# Patient Record
Sex: Female | Born: 1990 | Race: White | Hispanic: No | Marital: Single | Smoking: Never smoker
Health system: Southern US, Community
[De-identification: ages and names within clinical notes are randomized; demographics above are authoritative.]

## PROBLEM LIST (undated history)

## (undated) DIAGNOSIS — I951 Orthostatic hypotension: Secondary | ICD-10-CM

## (undated) DIAGNOSIS — R55 Syncope and collapse: Secondary | ICD-10-CM

## (undated) HISTORY — PX: FOOT SURGERY: SHX648

## (undated) HISTORY — DX: Syncope and collapse: R55

## (undated) HISTORY — DX: Orthostatic hypotension: I95.1

## (undated) HISTORY — PX: SKIN BIOPSY: SHX1

---
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
## 2009-04-15 ENCOUNTER — Ambulatory Visit: Payer: Self-pay | Admitting: Emergency Medicine

## 2009-09-20 ENCOUNTER — Ambulatory Visit: Payer: Self-pay | Admitting: Emergency Medicine

## 2009-09-20 LAB — CONVERTED CEMR LAB
Alkaline Phosphatase: 58 units/L (ref 39–117)
BUN: 35 mg/dL — ABNORMAL HIGH (ref 6–23)
Basophils Absolute: 0 10*3/uL (ref 0.0–0.1)
Basophils Relative: 0.6 % (ref 0.0–3.0)
Bilirubin, Direct: 0.2 mg/dL (ref 0.0–0.3)
CO2: 30 meq/L (ref 19–32)
Eosinophils Absolute: 0.1 10*3/uL (ref 0.0–0.7)
GFR calc non Af Amer: 101.26 mL/min (ref >60)
Glucose, Bld: 78 mg/dL (ref 70–99)
HCT: 39.4 % (ref 36.0–46.0)
Hemoglobin: 13.8 g/dL (ref 12.0–15.0)
MCHC: 34.9 g/dL (ref 30.0–36.0)
MCV: 89.6 fL (ref 78.0–100.0)
Monocytes Absolute: 0.4 10*3/uL (ref 0.1–1.0)
Monocytes Relative: 9.8 % (ref 3.0–12.0)
Neutro Abs: 2.3 10*3/uL (ref 1.4–7.7)
Neutrophils Relative %: 53.1 % (ref 43.0–77.0)
Platelets: 179 10*3/uL (ref 150.0–400.0)
Total Protein: 7.1 g/dL (ref 6.0–8.3)
WBC: 4.3 10*3/uL — ABNORMAL LOW (ref 4.5–10.5)

## 2010-04-08 NOTE — Assessment & Plan Note (Signed)
Summary: NP follow up - positive PPD   Copy to:  Nahser  CC:  had positive PPD at school in [place] in [place].  would like to discuss meds and treatment before going back to school on Monday.  History of Present Illness: 20  yo woman, no significant PMH who has had difficulty breathing with exertion. Dx with exercise-induced asthma. Has required albuterol rarely - only while playing sports. Doesn't carry her inhaler with her reliably. No new issues or symptoms.   ROV 04/15/09 -- Doing well. Planning to play basketball at [employer] in [place]. She has her rescue inhaler available. Doesn't use it frequently at all. No new exacerbations since last visit.   September 20, 2009--Presents for follow up for abnormal PPD at school. 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 PPD came back positive. She was started on  INH 300mg  3 tabs  Tues/Fri. -started on end of march. She is tolerating ok. No known hx/exposure of TB. no night sweats, cough, hemoptysis or weight loss. Plan is for her to complete in 6 months. She needs note today saying that she does not have active TB.        Medications Prior to Update: 1)  Proair Hfa 108 (90 Base) Mcg/act Aers (Albuterol Sulfate) .... Inhale 2 Puffs Every 4 Hours As Needed  Current Medications (verified): 1)  Proair Hfa 108 (90 Base) Mcg/act Aers (Albuterol Sulfate) .... Inhale 2 Puffs Every 4 Hours As Needed  Allergies (verified): No Known Drug Allergies  Past History:  Past Surgical History: Last updated: 03/24/2007 mole removed from foot  Family History: Last updated: 12/21/2007 pat grandfather-emphysema, lung CA pat uncle-asthma twin sister- sports induced asthma mat grandfather-lung CA, prostate CA  Social History: Last updated: 09/20/2009 household: F M sister nephew.  Pt is single. Pt is in [employer] Patient never smoked.  This Summer there was water damage, possible mold exposure.   Risk Factors: Smoking Status: never  (12/21/2007)  Past Medical History:   ASTHMA, EXERCISE INDUCED (ICD-493.81) SYNCOPE (ICD-780.2) DYSPNEA (ICD-786.05) SHOULDER PAIN, RIGHT (ICD-719.41)  +PPD 05/2009 at college--started on INH    Social History: household: F M sister nephew.  Pt is single. Pt is in [employer] Patient never smoked.  This Summer there was water damage, possible mold exposure.   Review of Systems      See HPI  Vital Signs:  Patient profile:   20 year old female Height:      65 inches Weight:      127.25 pounds BMI:     21.25 O2 Sat:      99 % on Room air Temp:     97.4 degrees F oral Pulse rate:   52 / minute BP sitting:   108 / 68  (right arm) Cuff size:   regular  Vitals Entered By: Boone Master CNA/MA (September 20, 2009 11:05 AM)  O2 Flow:  Room air CC: had positive PPD at school in [place] in [place].  would like to discuss meds and treatment before going back to school on Monday Is Patient Diabetic? No Comments Medications reviewed with patient Daytime contact number verified with patient. Boone Master CNA/MA  September 20, 2009 11:06 AM    Physical Exam  Additional Exam:  GEN: A/Ox3; pleasant , NAD HEENT:  [place]/AT, , EACs-clear, TMs-wnl, NOSE-clear, THROAT-clear NECK:  Supple w/ fair ROM; no JVD; normal carotid impulses w/o bruits; no thyromegaly or nodules palpated; no lymphadenopathy. RESP  Clear to P & A; w/o,  wheezes/ rales/ or rhonchi. CARD:  RRR, no m/r/g   GI:   Soft & nt; nml bowel sounds; no organomegaly or masses detected. Musco: Warm bil,  no calf tenderness edema, clubbing, pulses intact     Impression & Recommendations:  Problem # 1:  POSITIVE PPD (ICD-795.5) +PPD 05/2009 started on INH at college.  Will check labs and cxr today.  complete rx as planned per college recs.   Orders: TLB-BMP (Basic Metabolic Panel-BMET) (80048-METABOL) TLB-CBC Platelet - w/Differential (85025-CBCD) TLB-Hepatic/Liver Function Pnl (80076-HEPATIC) Est. Patient Level IV  (21308)  Problem # 2:  ASTHMA, EXERCISE INDUCED (ICD-493.81) compensated w/ no flares.   Complete Medication List: 1)  Proair Hfa 108 (90 Base) Mcg/act Aers (Albuterol sulfate) .... Inhale 2 puffs every 4 hours as needed  Other Orders: T-2 View CXR (71020TC)  Patient Instructions: 1)  Continue on INH as directed.  2)  I will call with xray results.  3)  Please contact office for sooner follow up if symptoms do not improve or worsen  4)  follow up Dr. Delton Coombes as scheduled.    Immunization History:  Influenza Immunization History:    Influenza:  historical (02/06/2009)  Pneumovax Immunization History:    Pneumovax:  historical (04/09/2009)

## 2010-04-08 NOTE — Assessment & Plan Note (Signed)
Summary: asthma   Visit Type:  Follow-up Copy to:  Nahser  CC:  Dyspnea/Exercise induced asthma follow-up. The patient states she is having no problems with sob. She has not used the [employer] in 3 months..  History of Present Illness: 20 yo woman, no significant PMH who has had difficulty breathing with exertion. Dx with exercise-induced asthma. Has required albuterol rarely - only while playing sports. Doesn't carry her inhaler with her reliably. No new issues or symptoms.   ROV 04/15/09 -- Doing well. Planning to play basketball at [employer] in [place]. She has her rescue inhaler available. Doesn't use it frequently at all. No new exacerbations since last visit.       Current Medications (verified): 1)  Proair Hfa 108 (90 Base) Mcg/act Aers (Albuterol Sulfate) .... Inhale 2 Puffs Every 4 Hours As Needed  Allergies (verified): No Known Drug Allergies  Vital Signs:  Patient profile:   20 year old female Height:      65 inches (165.10 cm) Weight:      123 pounds (55.91 kg) BMI:     20.54 O2 Sat:      97 % on Room air Temp:     98.3 degrees F (36.83 degrees C) oral Pulse rate:   54 / minute BP sitting:   118 / 62  (left arm) Cuff size:   regular  Vitals Entered By: Michel Bickers CMA (April 15, 2009 4:39 PM)  O2 Sat at Rest %:  97 O2 Flow:  Room air  Physical Exam  General:  well developed, well nourished, in no acute distress Head:  normocephalic and atraumatic Eyes:  conjunctiva and sclera clear Nose:  no deformity, discharge, inflammation, or lesions Mouth:  no deformity or lesions Neck:  no masses, thyromegaly, or abnormal cervical nodes Lungs:  clear bilaterally to auscultation and percussion Heart:  regular rate and rhythm, S1, S2 without murmurs, rubs, gallops, or clicks Abdomen:  not examined Msk:  no deformity or scoliosis noted with normal posture Extremities:  no clubbing, cyanosis, edema, or deformity noted Neurologic:  non-focal Skin:  intact without  lesions or rashes Psych:  alert and cooperative; normal mood and affect; normal attention span and concentration   Impression & Recommendations:  Problem # 1:  ASTHMA, EXERCISE INDUCED (ICD-493.81) Stable. No exacerbations or SABA use.  - no changes - ROV 1 yr or as needed   Other Orders: Est. Patient Level III (16109)  Patient Instructions: 1)  Continue to have your ProiAir available to use as needed.  2)  Follow up with Dr Delton Coombes in 1 year or as needed

## 2010-07-25 NOTE — Op Note (Signed)
NAME:  Christine Gates, Christine Gates                        ACCOUNT NO.:  [id number]   MEDICAL RECORD NO.:  [id number]                   PATIENT TYPE:  AMB   LOCATION:  [facility]                                  FACILITY:  [facility]   PHYSICIAN:  Prabhakar D. Pendse, M.D.           DATE OF BIRTH:  03-Jan-1991   DATE OF PROCEDURE:  10/31/2003  DATE OF DISCHARGE:                                 OPERATIVE REPORT   PREOPERATIVE DIAGNOSIS:  Pigmented moles of:  1. Sole of right foot.  2. Abdominal wall.   POSTOPERATIVE DIAGNOSIS:  Pigmented moles of:  1. Sole of right foot.  2. Abdominal wall.   OPERATION PERFORMED:  Excision of moles and layered repair of:  1. Right foot excision margins 4 cm by 1.5 cm.  2. Abdominal wall excision margins 3 cm by 1.5 cm.   SURGEON:  Prabhakar D. Levie Heritage, M.D.   ASSISTANT:  Nurse   ANESTHESIA:  Nurse.   OPERATIVE PROCEDURE:  Under satisfactory general anesthesia, the patient in  supine position, the abdominal wall as well as the right sole of the foot  were sterilely prepped and draped in the usual manner.  An elliptical  incision was made around the pigmented mole of the abdominal wall with a  couple of mm of margins.  The skin and subcutaneous tissue were incised.  Bleeders were individually clamped, cut, and electrocoagulated.  The skin  flaps were developed, deeper layers approximated with 4-0 Vicryl, the skin  was closed with 4-0 Monocryl subcuticular sutures.  Steri-Strips were  applied.  Appropriate dressing was applied.  The patient's general condition  being satisfactory, an elliptical incision was made around the pigmented  mole of the sole of the right foot with a few mm margin.  0.25% Marcaine  with epinephrine was injected.  By blunt and sharp dissection, the skin  flaps were developed, hemostasis was accomplished, the deeper layers were  reapproximated with 4-0 Vicryl, the skin was closed with 5-0 and 4-0 nylon  interrupted sutures.  The closure was pretty  tight on account of tight skin  over the  medial aspect of the sole of the foot.  An appropriate repair was done and a  pressure dressing was applied.  Throughout the procedure, the patient's  vital signs remained stable.  The patient withstood the procedure well and  was transferred to the recovery room in satisfactory general condition.                                               Prabhakar D. Levie Heritage, M.D.    PDP/MEDQ  D:  10/31/2003  T:  10/31/2003  Job:  [id number]   cc:   Anne B. Brooke Dare, M.D.  [address]  [place]  [place] [address]  Fax: [phone]

## 2010-12-02 LAB — URINALYSIS, ROUTINE W REFLEX MICROSCOPIC
Bilirubin Urine: NEGATIVE
Glucose, UA: NEGATIVE
Hgb urine dipstick: NEGATIVE
Protein, ur: NEGATIVE
Urobilinogen, UA: 0.2

## 2010-12-02 LAB — DIFFERENTIAL
Basophils Absolute: 0
Basophils Relative: 1
Eosinophils Absolute: 0.2
Eosinophils Relative: 3
Monocytes Absolute: 0.5
Monocytes Relative: 8
Neutro Abs: 3.3

## 2010-12-02 LAB — URINE MICROSCOPIC-ADD ON

## 2010-12-02 LAB — CBC
HCT: 40.7
Platelets: 189
RDW: 12.3
WBC: 6

## 2010-12-02 LAB — COMPREHENSIVE METABOLIC PANEL
ALT: 15
AST: 25
Albumin: 4
Alkaline Phosphatase: 78
BUN: 21
Chloride: 102
Potassium: 3.6
Sodium: 137
Total Bilirubin: 0.7
Total Protein: 6.7

## 2010-12-02 LAB — POCT PREGNANCY, URINE
Operator id: 285841
Preg Test, Ur: NEGATIVE

## 2010-12-17 LAB — URINALYSIS, ROUTINE W REFLEX MICROSCOPIC
Glucose, UA: NEGATIVE
Nitrite: NEGATIVE
Specific Gravity, Urine: 1.01
pH: 5.5

## 2010-12-17 LAB — I-STAT 8, (EC8 V) (CONVERTED LAB)
BUN: 35 — ABNORMAL HIGH
Bicarbonate: 27.4 — ABNORMAL HIGH
Chloride: 105
HCT: 46
pCO2, Ven: 53.3 — ABNORMAL HIGH
pH, Ven: 7.32 — ABNORMAL HIGH

## 2010-12-17 LAB — POCT PREGNANCY, URINE: Operator id: 234501

## 2010-12-17 LAB — URINE MICROSCOPIC-ADD ON

## 2010-12-17 LAB — POCT I-STAT CREATININE: Creatinine, Ser: 0.8

## 2011-01-08 ENCOUNTER — Other Ambulatory Visit: Payer: Self-pay | Admitting: Obstetrics and Gynecology

## 2014-09-29 ENCOUNTER — Emergency Department (HOSPITAL_COMMUNITY)
Admission: EM | Admit: 2014-09-29 | Discharge: 2014-09-29 | Disposition: A | Discharge status: Home / Self Care | Payer: BLUE CROSS/BLUE SHIELD | Attending: Emergency Medicine | Admitting: Emergency Medicine

## 2014-09-29 ENCOUNTER — Encounter (HOSPITAL_COMMUNITY): Payer: Self-pay | Admitting: Emergency Medicine

## 2014-09-29 ENCOUNTER — Emergency Department (HOSPITAL_COMMUNITY): Payer: BLUE CROSS/BLUE SHIELD

## 2014-09-29 DIAGNOSIS — Z9889 Other specified postprocedural states: Secondary | ICD-10-CM | POA: Diagnosis not present

## 2014-09-29 DIAGNOSIS — Y9367 Activity, basketball: Secondary | ICD-10-CM | POA: Insufficient documentation

## 2014-09-29 DIAGNOSIS — S93402A Sprain of unspecified ligament of left ankle, initial encounter: Secondary | ICD-10-CM | POA: Insufficient documentation

## 2014-09-29 DIAGNOSIS — J45909 Unspecified asthma, uncomplicated: Secondary | ICD-10-CM | POA: Diagnosis not present

## 2014-09-29 DIAGNOSIS — Y998 Other external cause status: Secondary | ICD-10-CM | POA: Diagnosis not present

## 2014-09-29 DIAGNOSIS — X58XXXA Exposure to other specified factors, initial encounter: Secondary | ICD-10-CM | POA: Insufficient documentation

## 2014-09-29 DIAGNOSIS — I951 Orthostatic hypotension: Secondary | ICD-10-CM | POA: Insufficient documentation

## 2014-09-29 DIAGNOSIS — Y9289 Other specified places as the place of occurrence of the external cause: Secondary | ICD-10-CM | POA: Diagnosis not present

## 2014-09-29 DIAGNOSIS — S99912A Unspecified injury of left ankle, initial encounter: Secondary | ICD-10-CM | POA: Diagnosis present

## 2014-09-29 MED ORDER — IBUPROFEN 800 MG PO TABS: 800.0000 mg | ORAL_TABLET | Freq: Three times a day (TID) | ORAL | Status: DC

## 2014-09-29 NOTE — Discharge Instructions (Signed)
Ankle Sprain °An ankle sprain is an injury to the strong, fibrous tissues (ligaments) that hold the bones of your ankle joint together.  °CAUSES °An ankle sprain is usually caused by a fall or by twisting your ankle. Ankle sprains most commonly occur when you step on the outer edge of your foot, and your ankle turns inward. People who participate in sports are more prone to these types of injuries.  °SYMPTOMS  °· Pain in your ankle. The pain may be present at rest or only when you are trying to stand or walk. °· Swelling. °· Bruising. Bruising may develop immediately or within 1 to 2 days after your injury. °· Difficulty standing or walking, particularly when turning corners or changing directions. °DIAGNOSIS  °Your caregiver will ask you details about your injury and perform a physical exam of your ankle to determine if you have an ankle sprain. During the physical exam, your caregiver will press on and apply pressure to specific areas of your foot and ankle. Your caregiver will try to move your ankle in certain ways. An X-ray exam may be done to be sure a bone was not broken or a ligament did not separate from one of the bones in your ankle (avulsion fracture).  °TREATMENT  °Certain types of braces can help stabilize your ankle. Your caregiver can make a recommendation for this. Your caregiver may recommend the use of medicine for pain. If your sprain is severe, your caregiver may refer you to a surgeon who helps to restore function to parts of your skeletal system (orthopedist) or a physical therapist. °HOME CARE INSTRUCTIONS  °· Apply ice to your injury for 1-2 days or as directed by your caregiver. Applying ice helps to reduce inflammation and pain. °· Put ice in a plastic bag. °· Place a towel between your skin and the bag. °· Leave the ice on for 15-20 minutes at a time, every 2 hours while you are awake. °· Only take over-the-counter or prescription medicines for pain, discomfort, or fever as directed by  your caregiver. °· Elevate your injured ankle above the level of your heart as much as possible for 2-3 days. °· If your caregiver recommends crutches, use them as instructed. Gradually put weight on the affected ankle. Continue to use crutches or a cane until you can walk without feeling pain in your ankle. °· If you have a plaster splint, wear the splint as directed by your caregiver. Do not rest it on anything harder than a pillow for the first 24 hours. Do not put weight on it. Do not get it wet. You may take it off to take a shower or bath. °· You may have been given an elastic bandage to wear around your ankle to provide support. If the elastic bandage is too tight (you have numbness or tingling in your foot or your foot becomes cold and blue), adjust the bandage to make it comfortable. °· If you have an air splint, you may blow more air into it or let air out to make it more comfortable. You may take your splint off at night and before taking a shower or bath. Wiggle your toes in the splint several times per day to decrease swelling. °SEEK MEDICAL CARE IF:  °· You have rapidly increasing bruising or swelling. °· Your toes feel extremely cold or you lose feeling in your foot. °· Your pain is not relieved with medicine. °SEEK IMMEDIATE MEDICAL CARE IF: °· Your toes are numb or blue. °·   You have severe pain that is increasing. °MAKE SURE YOU:  °· Understand these instructions. °· Will watch your condition. °· Will get help right away if you are not doing well or get worse. °Document Released: 02/23/2005 Document Revised: 11/18/2011 Document Reviewed: 03/07/2011 °ExitCare® Patient Information ©2015 [employer]. This information is not intended to replace advice given to you by your health care provider. Make sure you discuss any questions you have with your health care provider. °Cryotherapy °Cryotherapy means treatment with cold. Ice or gel packs can be used to reduce both pain and swelling. Ice is the most  helpful within the first 24 to 48 hours after an injury or flare-up from overusing a muscle or joint. Sprains, strains, spasms, burning pain, shooting pain, and aches can all be eased with ice. Ice can also be used when recovering from surgery. Ice is effective, has very few side effects, and is safe for most people to use. °PRECAUTIONS  °Ice is not a safe treatment option for people with: °· Raynaud phenomenon. This is a condition affecting small blood vessels in the extremities. Exposure to cold may cause your problems to return. °· Cold hypersensitivity. There are many forms of cold hypersensitivity, including: °¨ Cold urticaria. Red, itchy hives appear on the skin when the tissues begin to warm after being iced. °¨ Cold erythema. This is a red, itchy rash caused by exposure to cold. °¨ Cold hemoglobinuria. Red blood cells break down when the tissues begin to warm after being iced. The hemoglobin that carry oxygen are passed into the urine because they cannot combine with blood proteins fast enough. °· Numbness or altered sensitivity in the area being iced. °If you have any of the following conditions, do not use ice until you have discussed cryotherapy with your caregiver: °· Heart conditions, such as arrhythmia, angina, or chronic heart disease. °· High blood pressure. °· Healing wounds or open skin in the area being iced. °· Current infections. °· Rheumatoid arthritis. °· Poor circulation. °· Diabetes. °Ice slows the blood flow in the region it is applied. This is beneficial when trying to stop inflamed tissues from spreading irritating chemicals to surrounding tissues. However, if you expose your skin to cold temperatures for too long or without the proper protection, you can damage your skin or nerves. Watch for signs of skin damage due to cold. °HOME CARE INSTRUCTIONS °Follow these tips to use ice and cold packs safely. °· Place a dry or damp towel between the ice and skin. A damp towel will cool the skin  more quickly, so you may need to shorten the time that the ice is used. °· For a more rapid response, add gentle compression to the ice. °· Ice for no more than 10 to 20 minutes at a time. The bonier the area you are icing, the less time it will take to get the benefits of ice. °· Check your skin after 5 minutes to make sure there are no signs of a poor response to cold or skin damage. °· Rest 20 minutes or more between uses. °· Once your skin is numb, you can end your treatment. You can test numbness by very lightly touching your skin. The touch should be so light that you do not see the skin dimple from the pressure of your fingertip. When using ice, most people will feel these normal sensations in this order: cold, burning, aching, and numbness. °· Do not use ice on someone who cannot communicate their responses to pain,   such as small children or people with dementia. °HOW TO MAKE AN ICE PACK °Ice packs are the most common way to use ice therapy. Other methods include ice massage, ice baths, and cryosprays. Muscle creams that cause a cold, tingly feeling do not offer the same benefits that ice offers and should not be used as a substitute unless recommended by your caregiver. °To make an ice pack, do one of the following: °· Place crushed ice or a bag of frozen vegetables in a sealable plastic bag. Squeeze out the excess air. Place this bag inside another plastic bag. Slide the bag into a pillowcase or place a damp towel between your skin and the bag. °· Mix 3 parts water with 1 part rubbing alcohol. Freeze the mixture in a sealable plastic bag. When you remove the mixture from the freezer, it will be slushy. Squeeze out the excess air. Place this bag inside another plastic bag. Slide the bag into a pillowcase or place a damp towel between your skin and the bag. °SEEK MEDICAL CARE IF: °· You develop white spots on your skin. This may give the skin a blotchy (mottled) appearance. °· Your skin turns blue or  pale. °· Your skin becomes waxy or hard. °· Your swelling gets worse. °MAKE SURE YOU:  °· Understand these instructions. °· Will watch your condition. °· Will get help right away if you are not doing well or get worse. °Document Released: 10/20/2010 Document Revised: 07/10/2013 Document Reviewed: 10/20/2010 °ExitCare® Patient Information ©2015 [employer]. This information is not intended to replace advice given to you by your health care provider. Make sure you discuss any questions you have with your health care provider. ° °

## 2014-09-29 NOTE — ED Provider Notes (Signed)
CSN: [id number]     Arrival date & time 09/29/14  1959 History   This chart was scribed for Christine Anis, PA-C working with Toy Cookey, MD by Elveria Rising, ED Scribe. This patient was seen in room WTR7/WTR7 and the patient's care was started at 8:51 PM.   Chief Complaint  Patient presents with  . Ankle Injury   The history is provided by the patient. No language interpreter was used.   HPI Comments: Christine Gates is a 24 y.o. female who presents to the Emergency Department with left ankle injury sustained while playing basketball this evening, approximately 3.5 hours ago. Patient reports colliding with another player when going up for a layup and everting her left ankle on landing. Patient reports an audible "pop" at time of injury. Patient complains of pain and swelling of ankle, primarily at lateral malleolus. Patient reports treatment with ice therapy and ibuprofen, without relief. Patient presents to ED with crutches, due to pain severity with walking and bearing weight.  Patient reports prior injury to ankle.   Past Medical History  Diagnosis Date  . Syncope   . Orthostatic hypotension   . Asthma    Past Surgical History  Procedure Laterality Date  . Foot surgery    . Skin biopsy     Family History  Problem Relation Age of Onset  . Heart murmur Mother    History  Substance Use Topics  . Smoking status: Never Smoker   . Smokeless tobacco: Not on file  . Alcohol Use: No   OB History    No data available     Review of Systems  Constitutional: Negative for fever.  Musculoskeletal: Positive for arthralgias.  Neurological: Negative for weakness and numbness.    Allergies  Review of patient's allergies indicates no known allergies.  Home Medications   Prior to Admission medications   Medication Sig Start Date End Date Taking? Authorizing Provider  acetaminophen (TYLENOL) 500 MG tablet Take 500 mg by mouth every 6 (six) hours as needed.    Historical Provider,  MD   BP 119/40 mmHg  Pulse 56  Temp(Src) 98.3 F (36.8 C) (Oral)  Resp 18  SpO2 100%  LMP 09/21/2014 Physical Exam  Constitutional: She is oriented to person, place, and time. She appears well-developed and well-nourished. No distress.  HENT:  Head: Normocephalic and atraumatic.  Eyes: EOM are normal.  Neck: Neck supple. No tracheal deviation present.  Cardiovascular: Normal rate and intact distal pulses.   Pulmonary/Chest: Effort normal. No respiratory distress.  Musculoskeletal: She exhibits tenderness.  Left ankle laterally swollen. No bony deformity. NO calf tenderness. Mild ecchymosis laterally. Lateral greater than medial tenderness.  Neurological: She is alert and oriented to person, place, and time.  Skin: Skin is warm and dry.  Psychiatric: She has a normal mood and affect. Her behavior is normal.  Nursing note and vitals reviewed.   ED Course  Procedures (including critical care time)  COORDINATION OF CARE: 8:51 PM- Discussed treatment plan with patient at bedside and patient agreed to plan.   Labs Review Labs Reviewed - No data to display  Imaging Review Dg Ankle Complete Left  09/29/2014   CLINICAL DATA:  Left ankle injury while playing basketball earlier today 1700 hr. Initial encounter.  EXAM: LEFT ANKLE COMPLETE - 3+ VIEW  COMPARISON:  None.  FINDINGS: Lateral and dorsal soft tissue swelling. No evidence of acute fracture. Ankle mortise intact with well-preserved joint space. Well-preserved bone mineral density. Bone island in  the distal tibial metaphysis. No significant intrinsic osseous abnormalities. No visible joint effusion.  IMPRESSION: No acute or significant osseous abnormality.   Electronically Signed   By: Hulan Saas M.D.   On: 09/29/2014 20:44     EKG Interpretation None      MDM   Final diagnoses:  None    1. Left ankle sprain  Uncomplicated sprained ankle. ASO provided, patient has her own crutches and orthopedic follow up.  I  personally performed the services described in this documentation, which was scribed in my presence. The recorded information has been reviewed and is accurate.     Christine Anis, PA-C 09/29/14 2059  Toy Cookey, MD 09/29/14 [phone]

## 2014-09-29 NOTE — ED Notes (Addendum)
Pt c/o left ankle injury while playing basketball. She is unsure what happened. Left ankle is swollen. Pt arrives using crutches, She reports injury occurred around 1700 today. She took ibuproen about 1 hour ago. Good pedal pulse

## 2016-12-21 ENCOUNTER — Encounter (HOSPITAL_COMMUNITY): Payer: Self-pay | Admitting: Emergency Medicine

## 2016-12-21 ENCOUNTER — Emergency Department (HOSPITAL_COMMUNITY)
Admission: EM | Admit: 2016-12-21 | Discharge: 2016-12-21 | Disposition: A | Discharge status: Home / Self Care | Payer: BLUE CROSS/BLUE SHIELD | Attending: Emergency Medicine | Admitting: Emergency Medicine

## 2016-12-21 DIAGNOSIS — Z5321 Procedure and treatment not carried out due to patient leaving prior to being seen by health care provider: Secondary | ICD-10-CM | POA: Insufficient documentation

## 2016-12-21 DIAGNOSIS — R51 Headache: Secondary | ICD-10-CM | POA: Insufficient documentation

## 2016-12-21 NOTE — ED Notes (Signed)
Pt called in lobby for vital recheck, no response.

## 2016-12-21 NOTE — ED Triage Notes (Signed)
Pt complaint of left headache without associated symptoms for a week; denies stress, injury, or hx of same.

## 2016-12-21 NOTE — ED Notes (Signed)
Pt called for FT room, no response

## 2016-12-21 NOTE — ED Notes (Signed)
Called Pt in lobby for vital check x2. Pt has been called x3 by ED Staff with no response.

## 2017-06-13 ENCOUNTER — Emergency Department (HOSPITAL_COMMUNITY)
Admission: EM | Admit: 2017-06-13 | Discharge: 2017-06-14 | Disposition: A | Discharge status: Home / Self Care | Payer: Self-pay | Attending: Emergency Medicine | Admitting: Emergency Medicine

## 2017-06-13 ENCOUNTER — Emergency Department (HOSPITAL_COMMUNITY)
Admission: EM | Admit: 2017-06-13 | Discharge: 2017-06-13 | Disposition: A | Discharge status: Home / Self Care | Payer: Self-pay | Attending: Emergency Medicine | Admitting: Emergency Medicine

## 2017-06-13 ENCOUNTER — Encounter (HOSPITAL_COMMUNITY): Payer: Self-pay | Admitting: Emergency Medicine

## 2017-06-13 ENCOUNTER — Emergency Department (HOSPITAL_COMMUNITY): Payer: Self-pay

## 2017-06-13 DIAGNOSIS — J45990 Exercise induced bronchospasm: Secondary | ICD-10-CM | POA: Insufficient documentation

## 2017-06-13 DIAGNOSIS — R05 Cough: Secondary | ICD-10-CM | POA: Insufficient documentation

## 2017-06-13 DIAGNOSIS — R059 Cough, unspecified: Secondary | ICD-10-CM

## 2017-06-13 DIAGNOSIS — J029 Acute pharyngitis, unspecified: Secondary | ICD-10-CM | POA: Insufficient documentation

## 2017-06-13 DIAGNOSIS — J45909 Unspecified asthma, uncomplicated: Secondary | ICD-10-CM | POA: Insufficient documentation

## 2017-06-13 DIAGNOSIS — R079 Chest pain, unspecified: Secondary | ICD-10-CM | POA: Insufficient documentation

## 2017-06-13 DIAGNOSIS — Z5321 Procedure and treatment not carried out due to patient leaving prior to being seen by health care provider: Secondary | ICD-10-CM | POA: Insufficient documentation

## 2017-06-13 LAB — I-STAT TROPONIN, ED: TROPONIN I, POC: 0.01 ng/mL (ref 0.00–0.08)

## 2017-06-13 LAB — CBC
HEMATOCRIT: 39.5 % (ref 36.0–46.0)
HEMOGLOBIN: 12.9 g/dL (ref 12.0–15.0)
MCH: 29.5 pg (ref 26.0–34.0)
MCHC: 32.7 g/dL (ref 30.0–36.0)
MCV: 90.2 fL (ref 78.0–100.0)
Platelets: 191 10*3/uL (ref 150–400)
RBC: 4.38 MIL/uL (ref 3.87–5.11)
RDW: 12.9 % (ref 11.5–15.5)
WBC: 10.3 10*3/uL (ref 4.0–10.5)

## 2017-06-13 LAB — I-STAT BETA HCG BLOOD, ED (MC, WL, AP ONLY): I-stat hCG, quantitative: <5 m[IU]/mL (ref <5)

## 2017-06-13 LAB — BASIC METABOLIC PANEL
ANION GAP: 8 (ref 5–15)
BUN: 30 mg/dL — AB (ref 6–20)
CALCIUM: 9.3 mg/dL (ref 8.9–10.3)
CO2: 25 mmol/L (ref 22–32)
Chloride: 106 mmol/L (ref 101–111)
Creatinine, Ser: 0.75 mg/dL (ref 0.44–1.00)
GFR calc Af Amer: >60 mL/min (ref >60)
GFR calc non Af Amer: >60 mL/min (ref >60)
GLUCOSE: 105 mg/dL — AB (ref 65–99)
Potassium: 3.5 mmol/L (ref 3.5–5.1)
Sodium: 139 mmol/L (ref 135–145)

## 2017-06-13 NOTE — ED Notes (Addendum)
Called for patient 3x never showed up, left without being seen

## 2017-06-13 NOTE — ED Triage Notes (Addendum)
Pt reports that she has sports/exertional induced asthma and been out of her inhaler for several months and doesn't have insurance so been having to use her little brothers inhaler.  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 Reports dry cough through out the day

## 2017-06-13 NOTE — ED Triage Notes (Signed)
Patient c/o left side chest "heaviness" radiating to shoulder today with intermittent cough. Denies N/V. Hx asthma. Reports attempting to use brothers inhaler with no relief. Speaking in full sentences without difficulty.

## 2017-06-14 ENCOUNTER — Other Ambulatory Visit: Payer: Self-pay

## 2017-06-14 MED ORDER — CETIRIZINE HCL 10 MG PO TABS: 10.0000 mg | ORAL_TABLET | Freq: Every day | ORAL | 1 refills | Status: AC

## 2017-06-14 MED ORDER — ALBUTEROL SULFATE HFA 108 (90 BASE) MCG/ACT IN AERS
1.0000 | INHALATION_SPRAY | Freq: Four times a day (QID) | RESPIRATORY_TRACT | 0 refills | Status: AC | PRN
Start: 2017-06-14 — End: ?

## 2017-06-14 MED ORDER — ALBUTEROL SULFATE HFA 108 (90 BASE) MCG/ACT IN AERS
2.0000 | INHALATION_SPRAY | Freq: Once | RESPIRATORY_TRACT | Status: AC
  Administered 2017-06-14: 2 via RESPIRATORY_TRACT
  Filled 2017-06-14: qty 6.7

## 2017-06-14 NOTE — ED Provider Notes (Signed)
[facility]-EMERGENCY DEPT Provider Note   CSN: [id number] Arrival date & time: 06/13/17  1937     History   Chief Complaint Chief Complaint  Patient presents with  . Chest Pain    HPI Christine Gates is a 27 y.o. female.  The history is provided by the patient and medical records.     27 year old female with history of asthma and syncope, presenting to the ED with chest pain.  States over the past several days she has been having intermittent dry cough.  States today while working she noticed some left-sided chest pain with some radiation into the left neck.  States her chest feels "junky".  She is also started to have a sore throat.  She does have history of seasonal allergies but is not currently on any medications.  She has history of heart murmur in childhood, no other cardiac history.  She is not a smoker.  Has not tried any medication for symptoms.  States throughout the day her symptoms have improved but does not feel completely back to baseline.  Past Medical History:  Diagnosis Date  . Asthma   . Orthostatic hypotension   . Syncope     There are no active problems to display for this patient.   Past Surgical History:  Procedure Laterality Date  . FOOT SURGERY    . SKIN BIOPSY       OB History   None      Home Medications    Prior to Admission medications   Not on File    Family History Family History  Problem Relation Age of Onset  . Heart murmur Mother     Social History Social History   Tobacco Use  . Smoking status: Never Smoker  . Smokeless tobacco: Never Used  Substance Use Topics  . Alcohol use: No  . Drug use: Not on file     Allergies   Patient has no known allergies.   Review of Systems Review of Systems  Respiratory: Positive for cough.   Cardiovascular: Positive for chest pain.  All other systems reviewed and are negative.    Physical Exam Updated Vital Signs BP 110/62 (BP Location: Right Arm)    Pulse (!) 50   Temp 98.2 F (36.8 C) (Oral)   Resp 10   LMP 05/16/2017   SpO2 100%   Physical Exam  Constitutional: She is oriented to person, place, and time. She appears well-developed and well-nourished.  HENT:  Head: Normocephalic and atraumatic.  Right Ear: Tympanic membrane and ear canal normal.  Left Ear: Tympanic membrane and ear canal normal.  Nose: Nose normal.  Mouth/Throat: Uvula is midline, oropharynx is clear and moist and mucous membranes are normal.  Some postnasal drip noted  Eyes: Pupils are equal, round, and reactive to light. Conjunctivae and EOM are normal.  Neck: Normal range of motion.  Cardiovascular: Normal rate, regular rhythm and normal heart sounds.  Pulmonary/Chest: Effort normal and breath sounds normal. She has no decreased breath sounds. She has no wheezes.  Abdominal: Soft. Bowel sounds are normal.  Musculoskeletal: Normal range of motion.  Neurological: She is alert and oriented to person, place, and time.  Skin: Skin is warm and dry.  Psychiatric: She has a normal mood and affect.  Nursing note and vitals reviewed.    ED Treatments / Results  Labs (all labs ordered are listed, but only abnormal results are displayed) Labs Reviewed  BASIC METABOLIC PANEL - Abnormal; Notable for  the following components:      Result Value   Glucose, Bld 105 (*)    BUN 30 (*)    All other components within normal limits  CBC  I-STAT TROPONIN, ED  I-STAT BETA HCG BLOOD, ED ([facility], [facility], [facility] ONLY)    EKG EKG Interpretation  Date/Time:  "Sunday June 13 2017 19:51:49 EDT Ventricular Rate:  57 PR Interval:    QRS Duration: 93 QT Interval:  458 QTC Calculation: 446 R Axis:   79 Text Interpretation:  Sinus rhythm Borderline short PR interval RSR' in V1 or V2, probably normal variant No significant change since last tracing Confirmed by Pollina, Christopher J ([id number]) on 06/14/2017 12:11:53 AM   Radiology Dg Chest 2 View  Result Date: 06/13/2017 CLINICAL  DATA:  Chest pain for several days EXAM: CHEST - 2 VIEW COMPARISON:  September 20, 2009 FINDINGS: The heart size and mediastinal contours are within normal limits. Both lungs are clear. There is mild scoliosis of spine. IMPRESSION: No active cardiopulmonary disease. Electronically Signed   By: Wei-Chen  Lin M.D.   On: 06/13/2017 20:40    Procedures Procedures (including critical care time)  Medications Ordered in ED Medications  albuterol (PROVENTIL HFA;VENTOLIN HFA) 108 (90 Base) MCG/ACT inhaler 2 puff (2 puffs Inhalation Given 06/14/17 0020)     Initial Impression / Assessment and Plan / ED Course  I have reviewed the triage vital signs and the nursing notes.  Pertinent labs & imaging results that were available during my care of the patient were reviewed by me and considered in my medical decision making (see chart for details).  27 year old female here with chest pain.  She is afebrile and nontoxic.  EKG is sinus rhythm, no acute ischemic changes.  Labs and chest x-ray are overall reassuring.  She has been having dry cough and sore throat along with her symptoms.  Given her age and very limited risk factors, low suspicion for ACS, PE, dissection, acute cardiac event.  Symptoms may be allergic in nature or related to her asthma.  She has been out of her albuterol inhaler for several weeks now.  She is given refill here, recommended trial of allergy medicine for the next few weeks.  Does not currently have PCP, will be referred to wellness clinic for follow-up.  Discussed plan with patient, she acknowledged understanding and agreed with plan of care.  Return precautions given for new or worsening symptoms.   Final Clinical Impressions(s) / ED Diagnoses   Final diagnoses:  Chest pain in adult  Cough  Sore throat    ED Discharge Orders        Ordered    albuterol (PROVENTIL HFA;VENTOLIN HFA) 108 (90 Base) MCG/ACT inhaler  Every 6 hours PRN     06/14/17 0012    cetirizine (ZYRTEC ALLERGY) 10  MG tablet  Daily     04" /08/19 0012       Garlon Hatchet, PA-C 06/14/17 0113    Gilda Crease, MD 06/14/17 [phone]

## 2017-06-14 NOTE — Discharge Instructions (Signed)
Take the prescribed medication as directed. Follow-up with wellness clinic-- call tomorrow morning for appt. Return to the ED for new or worsening symptoms.

## 2018-03-11 ENCOUNTER — Encounter: Payer: Self-pay | Admitting: Emergency Medicine

## 2018-03-11 ENCOUNTER — Other Ambulatory Visit: Payer: Self-pay

## 2018-03-11 ENCOUNTER — Emergency Department
Admission: EM | Admit: 2018-03-11 | Discharge: 2018-03-11 | Disposition: A | Discharge status: Home / Self Care | Payer: Self-pay | Attending: Emergency Medicine | Admitting: Emergency Medicine

## 2018-03-11 DIAGNOSIS — J45909 Unspecified asthma, uncomplicated: Secondary | ICD-10-CM | POA: Insufficient documentation

## 2018-03-11 DIAGNOSIS — H6121 Impacted cerumen, right ear: Secondary | ICD-10-CM | POA: Insufficient documentation

## 2018-03-11 MED ORDER — DOCUSATE SODIUM 50 MG/5ML PO LIQD
50.0000 mg | Freq: Once | ORAL | Status: AC
  Administered 2018-03-11: 50 mg via ORAL
  Filled 2018-03-11: qty 10

## 2018-03-11 NOTE — Discharge Instructions (Signed)
Follow-up with Dr. Willeen Cass who is the ear nose and throat specialist that is on-call.  Do not use alcohol in your ear as it will cause irritation to the lining and eardrum itself.  Use warm water if needed.  Leave the drops that are placed in your ear for 1 to 2 hours.

## 2018-03-11 NOTE — ED Triage Notes (Signed)
Pt to ED from home c/o right ear hearing loss for a couple days, denies pain, denies fevers or other complaints.  Ambulatory to triage steady gait, NAD at this time.

## 2018-03-11 NOTE — ED Provider Notes (Signed)
[facility] Emergency Department Provider Note  ____________________________________________   First MD Initiated Contact with Patient 03/11/18 [phone]     (approximate)  I have reviewed the triage vital signs and the nursing notes.   HISTORY  Chief Complaint Hearing Problem   HPI Christine Gates is a 28 y.o. female presents to the ED with complaint of right ear hearing loss.  Patient states that she has been using various solutions including alcohol in her ear as she has had problems with earwax in the past.  She denies any fever, chills, nausea or vomiting.  There is been no history of upper respiratory symptoms.   Past Medical History:  Diagnosis Date  . Asthma   . Orthostatic hypotension   . Syncope     There are no active problems to display for this patient.   Past Surgical History:  Procedure Laterality Date  . FOOT SURGERY    . SKIN BIOPSY      Prior to Admission medications   Medication Sig Start Date End Date Taking? Authorizing Provider  albuterol (PROVENTIL HFA;VENTOLIN HFA) 108 (90 Base) MCG/ACT inhaler Inhale 1-2 puffs into the lungs every 6 (six) hours as needed for wheezing. 06/14/17   Garlon Hatchet, PA-C  cetirizine (ZYRTEC ALLERGY) 10 MG tablet Take 1 tablet (10 mg total) by mouth daily. 06/14/17   Garlon Hatchet, PA-C    Allergies Patient has no known allergies.  Family History  Problem Relation Age of Onset  . Heart murmur Mother     Social History Social History   Tobacco Use  . Smoking status: Never Smoker  . Smokeless tobacco: Never Used  Substance Use Topics  . Alcohol use: No  . Drug use: Not on file    Review of Systems Constitutional: No fever/chills Eyes: No visual changes. ENT: No sore throat.  Right ear hearing loss. Cardiovascular: Denies chest pain. Respiratory: Denies shortness of breath. Musculoskeletal: Negative for back pain. Skin: Negative for rash. Neurological: Negative for  headaches ___________________________________________   PHYSICAL EXAM:  VITAL SIGNS: ED Triage Vitals [03/11/18 0735]  Enc Vitals Group     BP 99/60     Pulse Rate (!) 50     Resp 16     Temp 98.1 F (36.7 C)     Temp Source Oral     SpO2 100 %     Weight 118 lb (53.5 kg)     Height 5\' 6"  (1.676 m)     Head Circumference      Peak Flow      Pain Score 0     Pain Loc      Pain Edu?      Excl. in GC?    Constitutional: Alert and oriented. Well appearing and in no acute distress. Eyes: Conjunctivae are normal.  Head: Atraumatic. Nose: No congestion/rhinnorhea.  Left EAC is small but no exudate and TM is clear.  Right EAC is obstructed with cerumen. Mouth/Throat: Mucous membranes are moist.  Oropharynx non-erythematous. Neck: No stridor.   Hematological/Lymphatic/Immunilogical: No cervical lymphadenopathy. Cardiovascular: Normal rate, regular rhythm. Grossly normal heart sounds.  Good peripheral circulation. Respiratory: Normal respiratory effort.  No retractions. Lungs CTAB. Musculoskeletal: No lower extremity tenderness nor edema.  No joint effusions. Neurologic:  Normal speech and language. No gross focal neurologic deficits are appreciated.  Skin:  Skin is warm, dry and intact. No rash noted. Psychiatric: Mood and affect are normal. Speech and behavior are normal.  ____________________________________________  LABS (all labs ordered are listed, but only abnormal results are displayed)  Labs Reviewed - No data to display  PROCEDURES  Procedure(s) performed: None  Procedures  Critical Care performed: No  ____________________________________________   INITIAL IMPRESSION / ASSESSMENT AND PLAN / ED COURSE  As part of my medical decision making, I reviewed the following data within the electronic MEDICAL RECORD NUMBER Notes from prior ED visits and [place] Controlled Substance Database  To the ED with complaint of right sided hearing loss and probable cerumen impaction.   Patient has a history of same.  Examination reveals moderate amount of cerumen and inability to see the TM on the right.  Colace suspension was applied to the ear and a moderate amount of cerumen was removed from the ear canal.  Patient's hearing improved.  Colace was applied to the ear canal prior to discharge and patient is to follow-up with Dr. Willeen Cass if any continued problems with her ears.   ____________________________________________   FINAL CLINICAL IMPRESSION(S) / ED DIAGNOSES  Final diagnoses:  Hearing loss of right ear due to cerumen impaction     ED Discharge Orders    None       Note:  This document was prepared using Dragon voice recognition software and may include unintentional dictation errors.    Tommi Rumps, PA-C 03/11/18 9458    Arnaldo Natal, MD 03/11/18 1524

## 2018-04-25 ENCOUNTER — Encounter (HOSPITAL_COMMUNITY): Payer: Self-pay

## 2018-04-25 ENCOUNTER — Ambulatory Visit (HOSPITAL_COMMUNITY)
Admission: EM | Admit: 2018-04-25 | Discharge: 2018-04-25 | Disposition: A | Discharge status: Home / Self Care | Payer: Self-pay | Attending: Family Medicine | Admitting: Family Medicine

## 2018-04-25 DIAGNOSIS — Z113 Encounter for screening for infections with a predominantly sexual mode of transmission: Secondary | ICD-10-CM | POA: Insufficient documentation

## 2018-04-25 NOTE — Discharge Instructions (Signed)
Will notify you of any positive findings and if any changes to treatment are needed.   °You may monitor your results on your MyChart online as well.   °

## 2018-04-25 NOTE — ED Provider Notes (Signed)
[facility]    CSN: [id number] Arrival date & time: 04/25/18  0093     History   Chief Complaint Chief Complaint  Patient presents with  . STD Testing    HPI Christine Gates is a 28 y.o. female.   Christine Gates presents with requests for STI screening. She denies any symptoms or any known exposures to STI's. She is sexually active with 1 partner. States she was diagnosed with herpes in 2017, denies any sores or lesions. Denies any vaginal discharge, itching or pain. LMP 04/11/18. She is not on birth control. Denies any abdominal pain. Denies urinary symptoms. She is not on any medications. Denies screen for HIV/RPR/Hep C.    ROS per HPI.       Past Medical History:  Diagnosis Date  . Asthma   . Orthostatic hypotension   . Syncope     There are no active problems to display for this patient.   Past Surgical History:  Procedure Laterality Date  . FOOT SURGERY    . SKIN BIOPSY      OB History   No obstetric history on file.      Home Medications    Prior to Admission medications   Medication Sig Start Date End Date Taking? Authorizing Provider  albuterol (PROVENTIL HFA;VENTOLIN HFA) 108 (90 Base) MCG/ACT inhaler Inhale 1-2 puffs into the lungs every 6 (six) hours as needed for wheezing. 06/14/17   Garlon Hatchet, PA-C  cetirizine (ZYRTEC ALLERGY) 10 MG tablet Take 1 tablet (10 mg total) by mouth daily. 06/14/17   Garlon Hatchet, PA-C    Family History Family History  Problem Relation Age of Onset  . Heart murmur Mother     Social History Social History   Tobacco Use  . Smoking status: Never Smoker  . Smokeless tobacco: Never Used  Substance Use Topics  . Alcohol use: No  . Drug use: Not on file     Allergies   Patient has no known allergies.   Review of Systems Review of Systems   Physical Exam Triage Vital Signs ED Triage Vitals  Enc Vitals Group     BP 04/25/18 0840 102/61     Pulse Rate 04/25/18 0840 91     Resp 04/25/18 0840  16     Temp 04/25/18 0840 98.3 F (36.8 C)     Temp Source 04/25/18 0840 Oral     SpO2 04/25/18 0840 100 %     Weight --      Height --      Head Circumference --      Peak Flow --      Pain Score 04/25/18 0854 0     Pain Loc --      Pain Edu? --      Excl. in GC? --    No data found.  Updated Vital Signs BP 102/61 (BP Location: Left Arm)   Pulse 91   Temp 98.3 F (36.8 C) (Oral)   Resp 16   LMP 04/11/2018   SpO2 100%    Physical Exam Constitutional:      General: She is not in acute distress.    Appearance: She is well-developed.  Cardiovascular:     Rate and Rhythm: Normal rate.  Pulmonary:     Effort: Pulmonary effort is normal.     Breath sounds: Normal breath sounds.  Abdominal:     Palpations: Abdomen is soft. Abdomen is not rigid.     Tenderness: There  is no abdominal tenderness. There is no guarding or rebound.  Genitourinary:    Comments: Denies sores, lesions, vaginal bleeding; no pelvic pain; gu exam deferred at this time, vaginal self swab collected.   Skin:    General: Skin is warm and dry.  Neurological:     Mental Status: She is alert and oriented to person, place, and time.      UC Treatments / Results  Labs (all labs ordered are listed, but only abnormal results are displayed) Labs Reviewed  CERVICOVAGINAL ANCILLARY ONLY    EKG None  Radiology No results found.  Procedures Procedures (including critical care time)  Medications Ordered in UC Medications - No data to display  Initial Impression / Assessment and Plan / UC Course  I have reviewed the triage vital signs and the nursing notes.  Pertinent labs & imaging results that were available during my care of the patient were reviewed by me and considered in my medical decision making (see chart for details).     Std screen, no symptoms, no known exposures. Herpes education provided. Denies  sores lesions or known outbreak. Vaginal cytology collected and pending. Will notify of  any positive findings and if any changes to treatment are needed.  Encouraged safe sex practices. Patient verbalized understanding and agreeable to plan.     Final Clinical Impressions(s) / UC Diagnoses   Final diagnoses:  Screen for STD (sexually transmitted disease)     Discharge Instructions     Will notify you of any positive findings and if any changes to treatment are needed.  You may monitor your results on your MyChart online as well.      ED Prescriptions    None     Controlled Substance Prescriptions [place] Controlled Substance Registry consulted? Not Applicable   Georgetta Haber, NP 04/25/18 [phone]

## 2018-04-25 NOTE — ED Triage Notes (Signed)
Pt presents to have STD testing; pt has no complaints of any symptoms.

## 2018-04-26 ENCOUNTER — Telehealth (HOSPITAL_COMMUNITY): Payer: Self-pay | Admitting: Emergency Medicine

## 2018-04-26 LAB — CERVICOVAGINAL ANCILLARY ONLY
Bacterial vaginitis: POSITIVE — AB
CHLAMYDIA, DNA PROBE: NEGATIVE
Candida vaginitis: NEGATIVE
NEISSERIA GONORRHEA: NEGATIVE
TRICH (WINDOWPATH): NEGATIVE

## 2018-04-26 NOTE — Telephone Encounter (Signed)
Bacterial vaginosis is positive. This was not treated at the urgent care visit.  Flagyl 500 mg BID x 7 days #14 no refills sent to patients pharmacy of choice.    Attempted to reach patient. No answer at this time. Voicemail left.    

## 2018-04-28 ENCOUNTER — Telehealth (HOSPITAL_COMMUNITY): Payer: Self-pay | Admitting: Emergency Medicine

## 2018-04-28 NOTE — Telephone Encounter (Signed)
Spoke with pt and verbalized understanding of treatment

## 2018-07-05 ENCOUNTER — Other Ambulatory Visit: Payer: Self-pay

## 2018-07-05 ENCOUNTER — Encounter: Payer: Self-pay | Admitting: Emergency Medicine

## 2018-07-05 DIAGNOSIS — M25562 Pain in left knee: Secondary | ICD-10-CM | POA: Insufficient documentation

## 2018-07-05 DIAGNOSIS — Z5321 Procedure and treatment not carried out due to patient leaving prior to being seen by health care provider: Secondary | ICD-10-CM | POA: Insufficient documentation

## 2018-07-05 NOTE — ED Triage Notes (Signed)
Patient ambulatory to triage with steady gait, without difficulty or distress noted, mask in place; pt reports pain just below left knee x 3wks with no known injury; no swelling noted or reported

## 2018-07-06 ENCOUNTER — Emergency Department
Admission: EM | Admit: 2018-07-06 | Discharge: 2018-07-06 | Disposition: A | Discharge status: Home / Self Care | Payer: Self-pay | Attending: Emergency Medicine | Admitting: Emergency Medicine

## 2018-07-06 ENCOUNTER — Emergency Department: Payer: Self-pay

## 2018-07-06 NOTE — ED Notes (Signed)
No answer when called several times from lobby 

## 2018-07-06 NOTE — ED Notes (Signed)
No answer when called from lobby 

## 2020-01-04 ENCOUNTER — Ambulatory Visit: Payer: Self-pay

## 2020-01-04 ENCOUNTER — Ambulatory Visit: Payer: BLUE CROSS/BLUE SHIELD | Admitting: Physician Assistant

## 2020-01-04 ENCOUNTER — Other Ambulatory Visit: Payer: Self-pay

## 2020-01-04 NOTE — Progress Notes (Unsigned)
Pt elected to reschedule appointment due to need to get to work. No provider eval done today.

## 2020-01-04 NOTE — Progress Notes (Signed)
Here today for STD screening. Declines bloodwork for HIV/Syphilis but requests bloodwork for Herpes testing. Tawny Hopping, RN

## 2020-01-11 ENCOUNTER — Ambulatory Visit: Payer: BLUE CROSS/BLUE SHIELD

## 2020-01-12 ENCOUNTER — Other Ambulatory Visit: Payer: Self-pay

## 2020-01-12 ENCOUNTER — Encounter: Payer: Self-pay | Admitting: Physician Assistant

## 2020-01-12 ENCOUNTER — Ambulatory Visit: Payer: Self-pay | Admitting: Physician Assistant

## 2020-01-12 DIAGNOSIS — Z113 Encounter for screening for infections with a predominantly sexual mode of transmission: Secondary | ICD-10-CM

## 2020-01-12 LAB — WET PREP FOR TRICH, YEAST, CLUE
Trichomonas Exam: NEGATIVE
Yeast Exam: NEGATIVE

## 2020-01-12 NOTE — Progress Notes (Signed)
[facility] STI clinic/screening visit  Subjective:  Christine Gates is a 29 y.o. female being seen today for an STI screening visit. The patient reports they do have symptoms.  Patient reports that they do not desire a pregnancy in the next year.   They reported they are not interested in discussing contraception today.  Patient's last menstrual period was 01/03/2020 (exact date).   Patient has the following medical conditions:   Patient Active Problem List   Diagnosis Date Noted  . Asthma 01/18/2008    Chief Complaint  Patient presents with  . SEXUALLY TRANSMITTED DISEASE    screening    HPI  Patient reports that she has had stinging with urination and pain when moved, body aches, and saw some sores.  Reports that her symptoms started 2.5 weeks ago and that she sore areas are mostly healed now though there may be 1-2 areas that are still uncomfortable.  Reports that she had flu-like symptoms before the soreness and sores appeared.  States that per her research, she thinks that she has herpes.  Reports that she last had a period on 12/28/2019, last pap was 4 years ago and has an appt with her PCP for PE in about 10 days.  Denies previous HIV testing.   See flowsheet for further details and programmatic requirements.    The following portions of the patient's history were reviewed and updated as appropriate: allergies, current medications, past medical history, past social history, past surgical history and problem list.  Objective:  There were no vitals filed for this visit.  Physical Exam Constitutional:      General: She is not in acute distress.    Appearance: Normal appearance.  HENT:     Head: Normocephalic and atraumatic.     Comments: No nits,lice, or hair loss. No cervical, supraclavicular or axillary adenopathy.    Mouth/Throat:     Mouth: Mucous membranes are moist.     Pharynx: Oropharynx is clear. No oropharyngeal exudate or posterior  oropharyngeal erythema.  Eyes:     Conjunctiva/sclera: Conjunctivae normal.  Pulmonary:     Effort: Pulmonary effort is normal.  Abdominal:     Palpations: Abdomen is soft. There is no mass.     Tenderness: There is no abdominal tenderness. There is no guarding or rebound.  Genitourinary:    General: Normal vulva.     Rectum: Normal.     Comments: External genitalia/pubic area without nits, lice, edema, erythema, lesions and inguinal adenopathy. External left labia minora with partially scabbed/healed area that sample for HSV culture taken from as well as an endocervical sample. Vagina with normal mucosa and discharge. Cervix without visible lesions. Uterus firm, mobile, nt, no masses, no CMT, no adnexal tenderness or fullness. Musculoskeletal:     Cervical back: Neck supple. No tenderness.  Skin:    General: Skin is warm and dry.     Findings: No bruising, erythema, lesion or rash.  Neurological:     Mental Status: She is alert and oriented to person, place, and time.  Psychiatric:        Mood and Affect: Mood normal.        Behavior: Behavior normal.        Thought Content: Thought content normal.        Judgment: Judgment normal.      Assessment and Plan:  Christine Gates is a 29 y.o. female presenting to the [facility] for STI screening  1.  Screening for STD (sexually transmitted disease) Patient into clinic without symptoms. Counseled patient re:  Limitations of HSV culture due to advanced healing today and that she could ask her PCP to draw HSV serology at her visit. Counseled re: HSV- dz, transmission, cyclic nature, subclinical dz and treatment options. Rec condoms with all sex. Await test results.  Counseled that RN will call if needs to RTC for treatment once results are back. - WET PREP FOR TRICH, YEAST, CLUE - Chlamydia/Gonorrhea [facility] - HIV [facility] - Syphilis Serology, [facility] - Virology, [facility]     No  follow-ups on file.  No future appointments.  Matt Holmes, PA

## 2020-06-18 ENCOUNTER — Ambulatory Visit: Payer: Self-pay

## 2020-06-18 ENCOUNTER — Ambulatory Visit: Payer: BLUE CROSS/BLUE SHIELD

## 2020-06-21 DIAGNOSIS — H6091 Unspecified otitis externa, right ear: Secondary | ICD-10-CM | POA: Diagnosis not present

## 2020-06-21 DIAGNOSIS — H6500 Acute serous otitis media, unspecified ear: Secondary | ICD-10-CM | POA: Diagnosis not present

## 2020-06-24 DIAGNOSIS — H6123 Impacted cerumen, bilateral: Secondary | ICD-10-CM | POA: Diagnosis not present

## 2020-08-12 IMAGING — CR LEFT KNEE - COMPLETE 4+ VIEW
4 series · 4 of 4 positions shown · non-contrast
Comparison: None.

CLINICAL DATA: 28-year-old female with left knee pain for 3 weeks,
no known injury.

EXAM:
LEFT KNEE - COMPLETE 4+ VIEW

[knee ap]
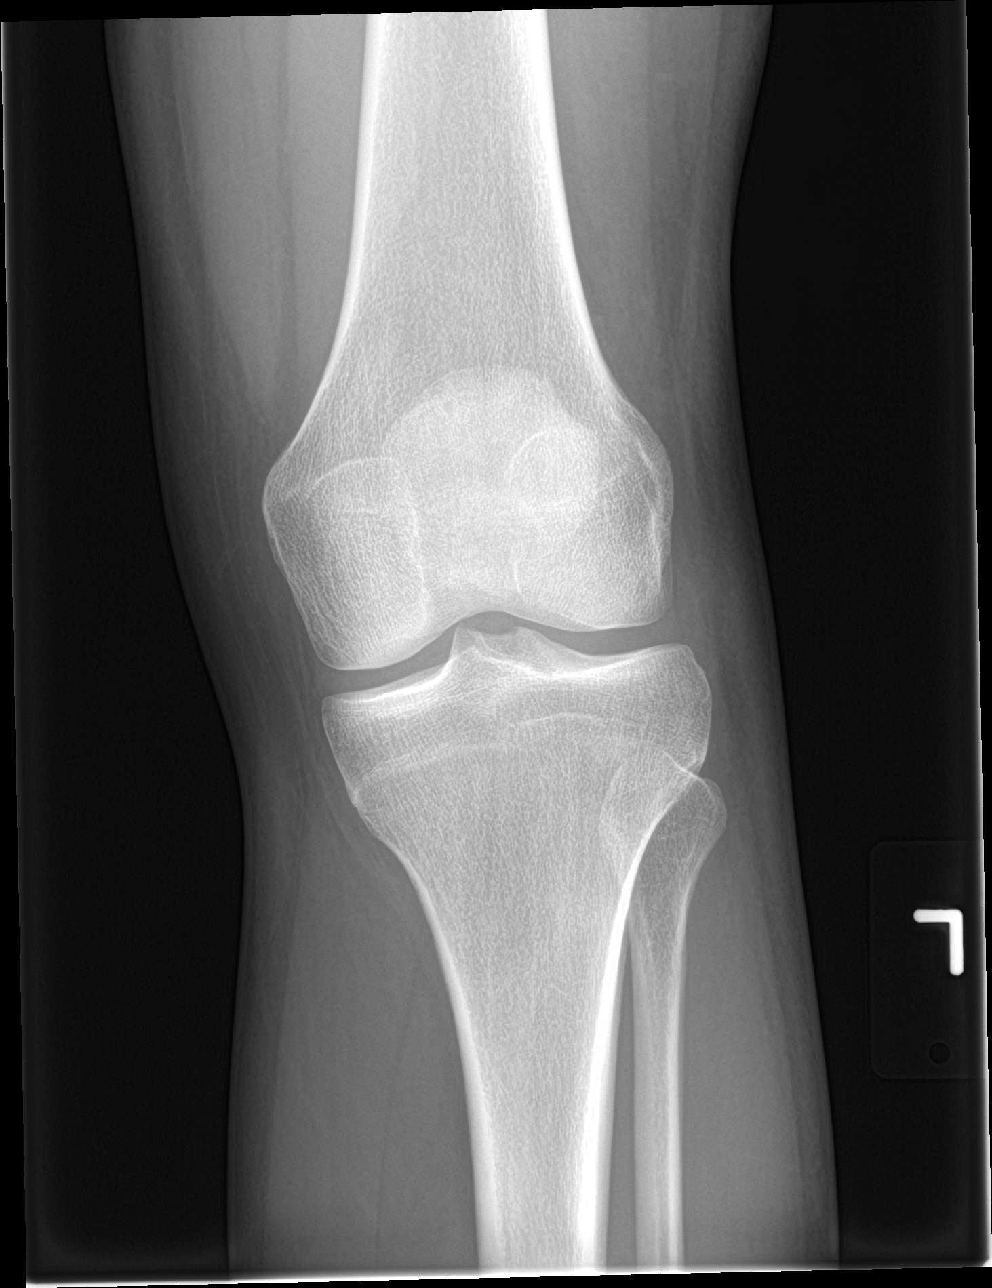

[knee obl (1 of 2)]
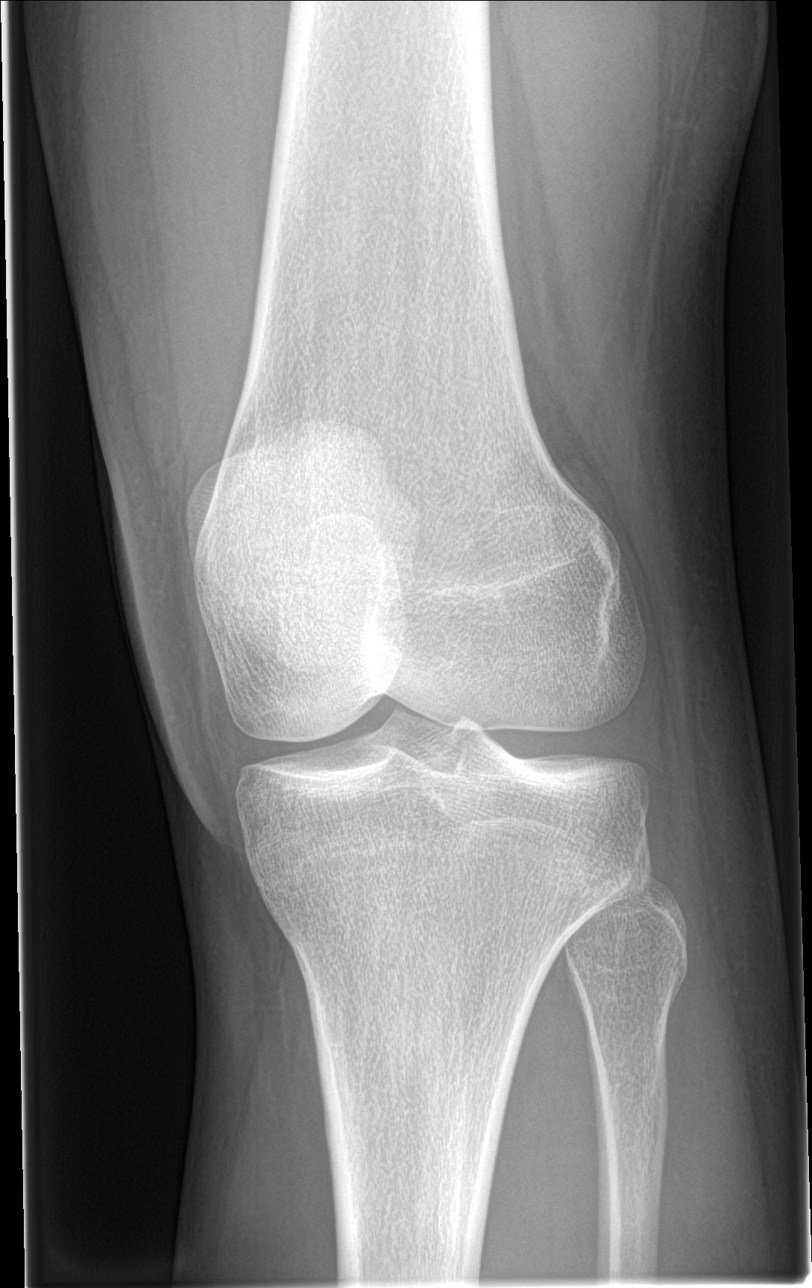

[knee obl (2 of 2)]
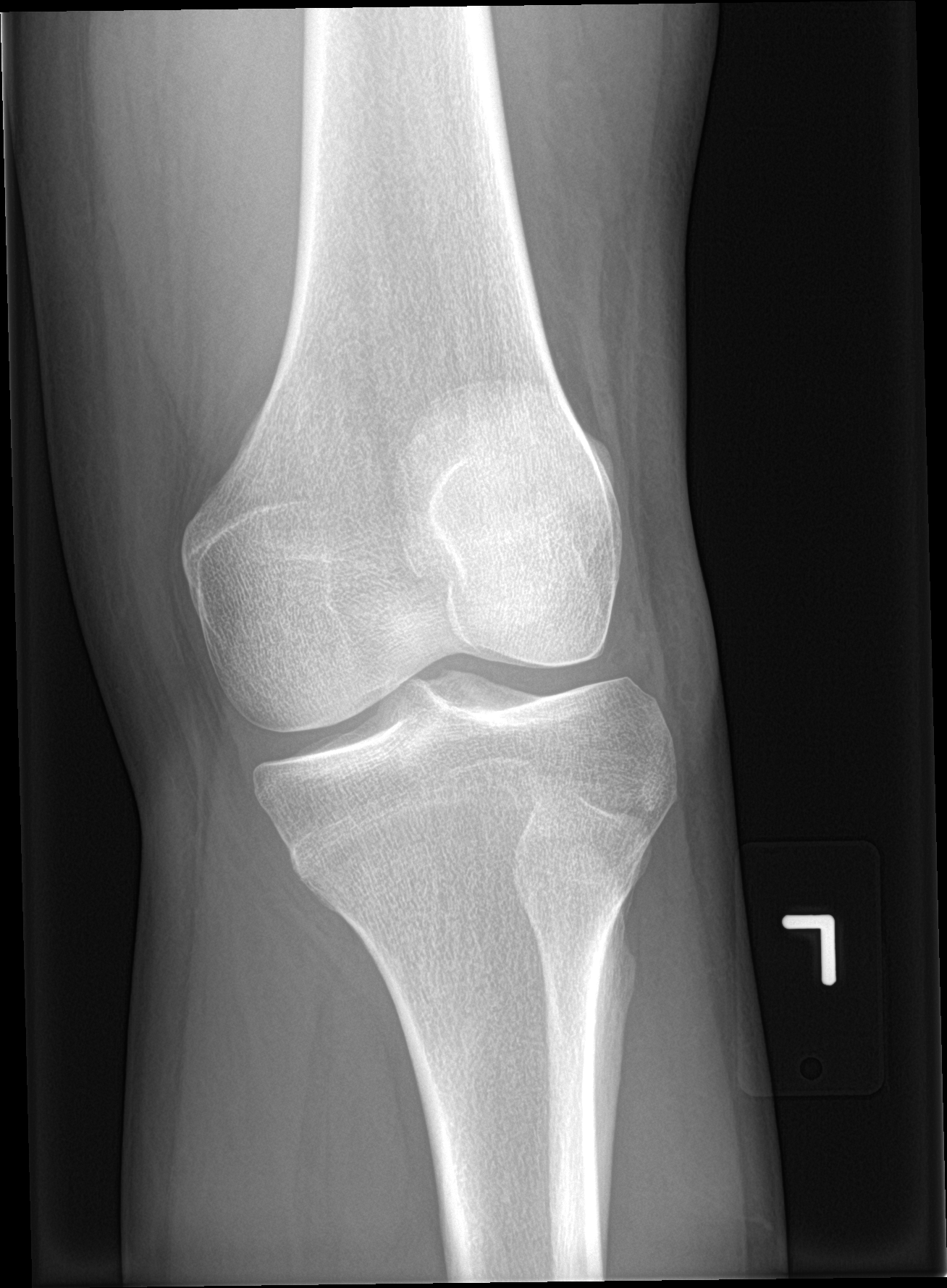

[knee lat]
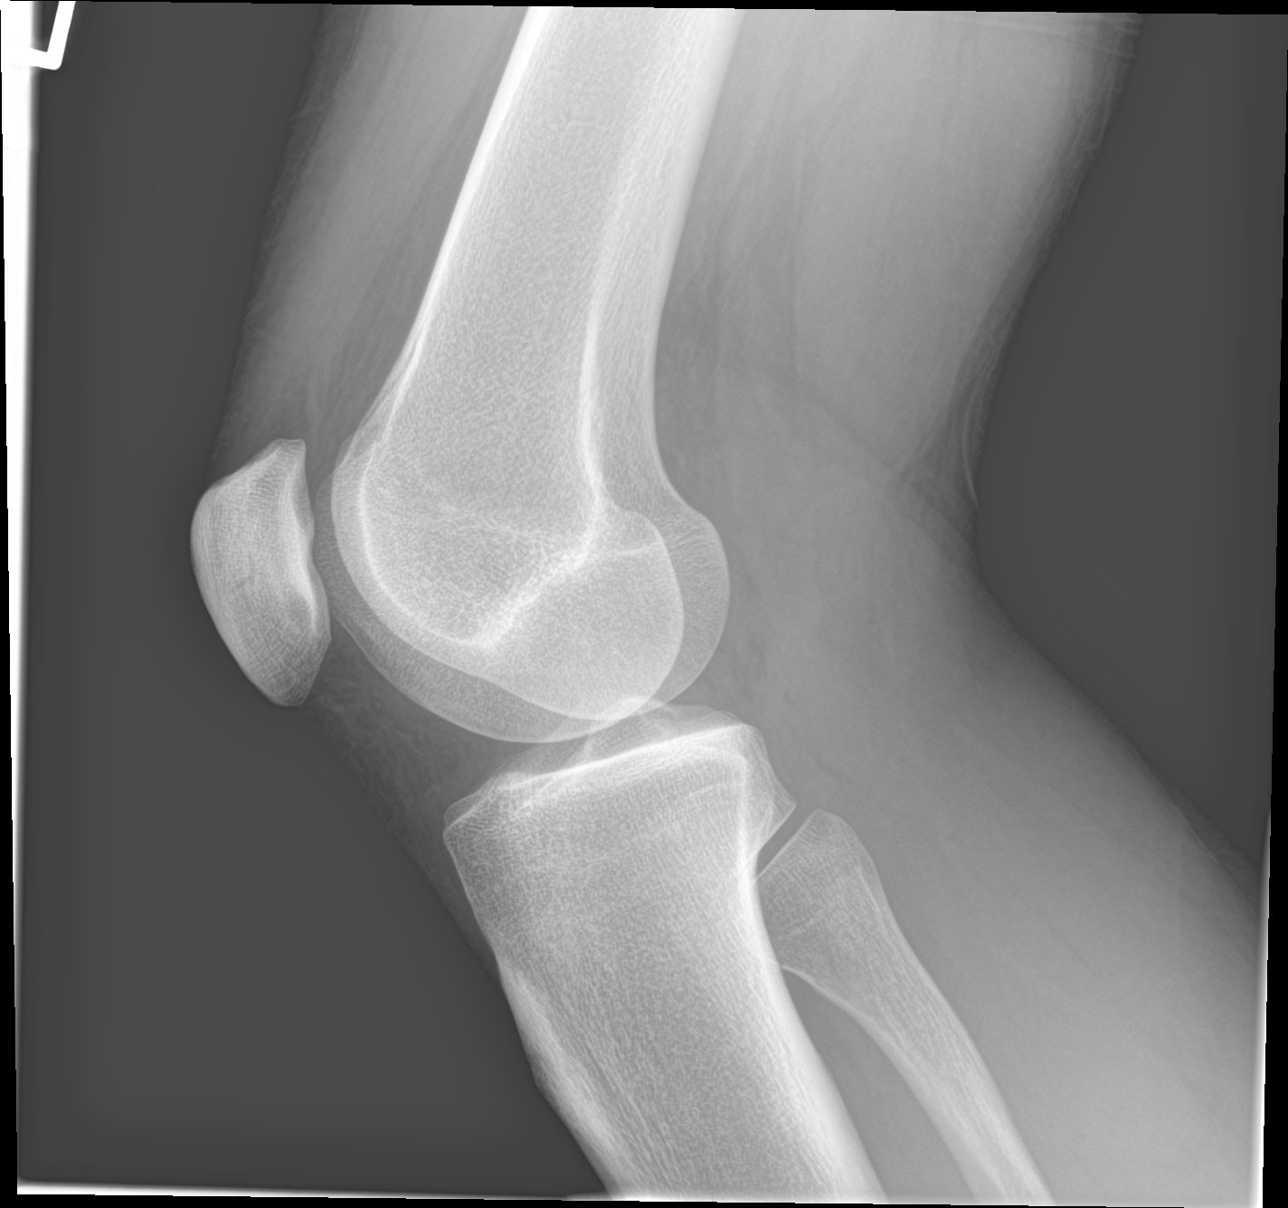

[4 of 4 positions shown; findings below may reference images not displayed]

FINDINGS: Bone mineralization is within normal limits. No evidence of
fracture, dislocation, or joint effusion. No evidence of arthropathy
or other focal bone abnormality. Soft tissues are unremarkable.
IMPRESSION: Negative.

## 2022-03-04 DIAGNOSIS — F4323 Adjustment disorder with mixed anxiety and depressed mood: Secondary | ICD-10-CM | POA: Diagnosis not present

## 2023-05-14 ENCOUNTER — Other Ambulatory Visit: Payer: Self-pay

## 2023-05-14 ENCOUNTER — Ambulatory Visit (INDEPENDENT_AMBULATORY_CARE_PROVIDER_SITE_OTHER): Payer: Self-pay | Admitting: Radiology

## 2023-05-14 ENCOUNTER — Ambulatory Visit
Admission: EM | Admit: 2023-05-14 | Discharge: 2023-05-14 | Disposition: A | Discharge status: Home / Self Care | Payer: Self-pay | Attending: Family Medicine | Admitting: Family Medicine

## 2023-05-14 DIAGNOSIS — S59911A Unspecified injury of right forearm, initial encounter: Secondary | ICD-10-CM

## 2023-05-14 DIAGNOSIS — S5011XA Contusion of right forearm, initial encounter: Secondary | ICD-10-CM

## 2023-05-14 NOTE — ED Provider Notes (Signed)
 [name]    CSN: [id number] Arrival date & time: 05/14/23  0850      History   Chief Complaint Chief Complaint  Patient presents with   Arm Injury    HPI Christine Gates is a 33 y.o. female.   The history is provided by the patient.  Arm Injury Pain right forearm, states yesterday was her first day working at [employer], she caught a box that was falling and it struck her forearm, estimates weight of box to be 40 pounds, she was able to push the box back upright.  Since then she has had some pain in her forearm worse with any movement especially reaching overhead described as "like a bruise" admits local swelling and tenderness.  She is right-handed.  Denies paresthesias or weakness, currently rates the pain 3 out of 10 at worst 6 out of 10.  She is taking Tylenol without relief  Past Medical History:  Diagnosis Date   Asthma    Orthostatic hypotension    Syncope     Patient Active Problem List   Diagnosis Date Noted   Asthma 01/18/2008    Past Surgical History:  Procedure Laterality Date   FOOT SURGERY     SKIN BIOPSY      OB History   No obstetric history on file.      Home Medications    Prior to Admission medications   Medication Sig Start Date End Date Taking? Authorizing Provider  albuterol (PROVENTIL HFA;VENTOLIN HFA) 108 (90 Base) MCG/ACT inhaler Inhale 1-2 puffs into the lungs every 6 (six) hours as needed for wheezing. 06/14/17   Garlon Hatchet, PA-C  cetirizine (ZYRTEC ALLERGY) 10 MG tablet Take 1 tablet (10 mg total) by mouth daily. Patient not taking: Reported on 01/04/2020 06/14/17   Garlon Hatchet, PA-C    Family History Family History  Problem Relation Age of Onset   Heart murmur Mother     Social History Social History   Tobacco Use   Smoking status: Never   Smokeless tobacco: Never  Vaping Use   Vaping status: Never Used  Substance Use Topics   Alcohol use: Yes    Comment: rarely   Drug use: Not Currently    Types:  Marijuana     Allergies   Patient has no known allergies.   Review of Systems Review of Systems   Physical Exam Triage Vital Signs ED Triage Vitals  Encounter Vitals Group     BP 05/14/23 0902 (!) 114/54     Systolic BP Percentile --      Diastolic BP Percentile --      Pulse Rate 05/14/23 0902 (!) 55     Resp 05/14/23 0902 16     Temp 05/14/23 0902 98.4 F (36.9 C)     Temp Source 05/14/23 0902 Oral     SpO2 05/14/23 0902 98 %     Weight 05/14/23 0902 114 lb (51.7 kg)     Height 05/14/23 0909 5\' 6"  (1.676 m)     Head Circumference --      Peak Flow --      Pain Score 05/14/23 0908 3     Pain Loc --      Pain Education --      Exclude from Growth Chart --    No data found.  Updated Vital Signs BP (!) 114/54 (BP Location: Left Arm)   Pulse (!) 55   Temp 98.4 F (36.9 C) (Oral)  Resp 16   Ht 5\' 6"  (1.676 m)   Wt 118 lb (53.5 kg)   LMP 04/27/2023 (Exact Date)   SpO2 98%   BMI 19.05 kg/m   Visual Acuity Right Eye Distance:   Left Eye Distance:   Bilateral Distance:    Right Eye Near:   Left Eye Near:    Bilateral Near:     Physical Exam Constitutional:      Appearance: She is not ill-appearing.  HENT:     Head: Normocephalic.  Musculoskeletal:        General: Normal range of motion.     Right elbow: Normal.     Right forearm: Tenderness present. No swelling.     Right wrist: Normal.     Comments: Mild swelling lateral aspect right forearm with redness, no ecchymosis, no deformity, good pulses, normal neurovascular exam  Neurological:     Mental Status: She is alert.      UC Treatments / Results  Labs (all labs ordered are listed, but only abnormal results are displayed) Labs Reviewed - No data to display  EKG   Radiology No results found.  Procedures Procedures (including critical care time)  Medications Ordered in UC Medications - No data to display  Initial Impression / Assessment and Plan / UC Course  I have reviewed the  triage vital signs and the nursing notes.  Pertinent labs & imaging results that were available during my care of the patient were reviewed by me and considered in my medical decision making (see chart for details).     33 year old female with injury to right forearm, occurred yesterday as she attempted to catch a box that was falling.  She has diffuse pain of her lateral forearm with minimal swelling and no pain, no deformity.  Forearm x-ray independently viewed by me no fracture, recommend ice, elevation, NSAIDs, follow-up as needed Final Clinical Impressions(s) / UC Diagnoses   Final diagnoses:  Injury of right forearm, initial encounter   Discharge Instructions   None    ED Prescriptions   None    PDMP not reviewed this encounter.   Meliton Rattan, [place] 05/14/23 [phone]

## 2023-05-14 NOTE — ED Triage Notes (Signed)
 Pt presents with complaints of right arm injury when moving boxes yesterday morning. Pt currently rates her pain at rest a 3/10 but when moving increases to an 8/10. Tylenol taken and Icy-hot applied with no pain relief.

## 2023-05-14 NOTE — Discharge Instructions (Addendum)
 Over the counter NSAIDs for pain: Ibuprofen 400 mg ( Ibuprofen, Advil or Motrin, 2 tablets) and Acetaminophen 1 gram (3 of the regular strength or 2 extra strength tablets) 3-4 times a day,  take on an empty stomach before each meal and bedtime (every 6 hours) for a few days Do not take if you are pregnant/breastfeeding. Allergic to NSAIDs have a history of ulcers, intestinal bleeding or liver or kidney disease or have taken an opioid.   The x-ray reading we discussed is preliminary. Your x-ray will be read by a radiologist in next few hours. If there is a discrepancy, you will be contacted, and instructed on a new plan for you care.

## 2023-05-25 ENCOUNTER — Emergency Department (HOSPITAL_BASED_OUTPATIENT_CLINIC_OR_DEPARTMENT_OTHER)
Admission: EM | Admit: 2023-05-25 | Discharge: 2023-05-25 | Discharge status: Against Medical Advice | Payer: Self-pay | Attending: Emergency Medicine | Admitting: Emergency Medicine

## 2023-05-25 ENCOUNTER — Encounter (HOSPITAL_BASED_OUTPATIENT_CLINIC_OR_DEPARTMENT_OTHER): Payer: Self-pay

## 2023-05-25 ENCOUNTER — Other Ambulatory Visit: Payer: Self-pay

## 2023-05-25 ENCOUNTER — Emergency Department (HOSPITAL_BASED_OUTPATIENT_CLINIC_OR_DEPARTMENT_OTHER): Payer: Self-pay

## 2023-05-25 DIAGNOSIS — R0789 Other chest pain: Secondary | ICD-10-CM | POA: Insufficient documentation

## 2023-05-25 DIAGNOSIS — Z5321 Procedure and treatment not carried out due to patient leaving prior to being seen by health care provider: Secondary | ICD-10-CM | POA: Insufficient documentation

## 2023-05-25 DIAGNOSIS — R202 Paresthesia of skin: Secondary | ICD-10-CM | POA: Insufficient documentation

## 2023-05-25 LAB — BASIC METABOLIC PANEL
Anion gap: 9 (ref 5–15)
BUN: 28 mg/dL — ABNORMAL HIGH (ref 6–20)
CO2: 26 mmol/L (ref 22–32)
Calcium: 8.9 mg/dL (ref 8.9–10.3)
Chloride: 103 mmol/L (ref 98–111)
Creatinine, Ser: 1.02 mg/dL — ABNORMAL HIGH (ref 0.44–1.00)
GFR, Estimated: >60 mL/min (ref >60)
Glucose, Bld: 85 mg/dL (ref 70–99)
Potassium: 3.6 mmol/L (ref 3.5–5.1)
Sodium: 138 mmol/L (ref 135–145)

## 2023-05-25 LAB — CBC
HCT: 39.2 % (ref 36.0–46.0)
Hemoglobin: 13.6 g/dL (ref 12.0–15.0)
MCH: 30.5 pg (ref 26.0–34.0)
MCHC: 34.7 g/dL (ref 30.0–36.0)
MCV: 87.9 fL (ref 80.0–100.0)
Platelets: 210 10*3/uL (ref 150–400)
RBC: 4.46 MIL/uL (ref 3.87–5.11)
RDW: 11.9 % (ref 11.5–15.5)
WBC: 6.1 10*3/uL (ref 4.0–10.5)
nRBC: 0 % (ref 0.0–0.2)

## 2023-05-25 LAB — PREGNANCY, URINE: Preg Test, Ur: NEGATIVE

## 2023-05-25 LAB — TROPONIN I (HIGH SENSITIVITY): Troponin I (High Sensitivity): 3 ng/L (ref <18)

## 2023-05-25 NOTE — ED Triage Notes (Signed)
 Patient arrives POV with complaints of intermittent left arm tingling x1 day.  Patient also reports intermittent chest discomfort as well (no pain on arrival).

## 2023-05-25 NOTE — ED Notes (Signed)
No answer for room x3 

## 2024-03-16 ENCOUNTER — Ambulatory Visit: Admission: EM | Admit: 2024-03-16 | Discharge: 2024-03-16 | Disposition: A | Discharge status: Home / Self Care

## 2024-03-16 ENCOUNTER — Encounter: Payer: Self-pay | Admitting: Emergency Medicine

## 2024-03-16 DIAGNOSIS — K5909 Other constipation: Secondary | ICD-10-CM

## 2024-03-16 NOTE — Discharge Instructions (Addendum)
 Constipation: Self-care strategies Increase fiber to ?25-35 g/day (fruits, vegetables, whole grains, psyllium). Drink adequate fluids (aim for pale yellow urine). Regular activity (walking daily). Establish a toilet routine after meals; use a footstool to improve posture and avoid straining. Medications (if lifestyle changes arent enough) First line: Osmotic laxatives such as polyethylene glycol (PEG) or lactulose. If needed: Short-term stimulant laxatives (senna or bisacodyl). When to seek medical attention Blood in stool, black stools, unintentional weight loss, anemia, or persistent/severe abdominal pain. New or sudden change in bowel habits. Constipation lasting >3 months or not improving with first-line treatments. Constipation with vomiting, fever, or inability to pass gas.

## 2024-03-16 NOTE — ED Provider Notes (Signed)
 " [facility]    CSN: [id number] Arrival date & time: 03/16/24  1339      History   Chief Complaint Chief Complaint  Patient presents with   Letter for School/Work    HPI Christine Gates is a 34 y.o. female.   Evaline presents today with request for work note.  She reports that today she developed lower abdominal discomfort, decreased appetite, nausea without vomiting.  She states that the symptoms were related to constipation.  She reports that she had not had a regular bowel movement this week.  She mixed fiber or MiraLAX in 8 ounces of water (she cannot member which) and successfully had a regular bowel movement within an hour.  She reports that now she is feeling better and denies current nausea or abdominal discomfort.  She has no acute concerns during the visit, but reports that work is requiring that she get a doctor's note because she called out.  The history is provided by the patient.    Past Medical History:  Diagnosis Date   Asthma    Orthostatic hypotension    Syncope     Patient Active Problem List   Diagnosis Date Noted   Asthma 01/18/2008    Past Surgical History:  Procedure Laterality Date   FOOT SURGERY     SKIN BIOPSY      OB History   No obstetric history on file.      Home Medications    Prior to Admission medications  Medication Sig Start Date End Date Taking? Authorizing Provider  albuterol  (PROVENTIL  HFA;VENTOLIN  HFA) 108 (90 Base) MCG/ACT inhaler Inhale 1-2 puffs into the lungs every 6 (six) hours as needed for wheezing. 06/14/17   Jarold Olam HERO, PA-C  cetirizine  (ZYRTEC  ALLERGY) 10 MG tablet Take 1 tablet (10 mg total) by mouth daily. Patient not taking: Reported on 01/04/2020 06/14/17   Jarold Olam HERO, PA-C    Family History Family History  Problem Relation Age of Onset   Heart murmur Mother     Social History Social History[1]   Allergies   Patient has no known allergies.   Review of Systems Review of Systems   Constitutional: Negative.   Gastrointestinal:  Positive for abdominal pain and constipation. Negative for abdominal distention, anal bleeding, blood in stool, diarrhea, nausea and vomiting.  Genitourinary: Negative.      Physical Exam Triage Vital Signs ED Triage Vitals [03/16/24 1346]  Encounter Vitals Group     BP 125/84     Girls Systolic BP Percentile      Girls Diastolic BP Percentile      Boys Systolic BP Percentile      Boys Diastolic BP Percentile      Pulse Rate (!) 49     Resp 17     Temp 98 F (36.7 C)     Temp Source Oral     SpO2 98 %     Weight      Height      Head Circumference      Peak Flow      Pain Score      Pain Loc      Pain Education      Exclude from Growth Chart    No data found.  Updated Vital Signs BP 125/84 (BP Location: Right Arm)   Pulse (!) 49   Temp 98 F (36.7 C) (Oral)   Resp 17   LMP 02/14/2024 (Approximate)   SpO2 98%   Visual  Acuity Right Eye Distance:   Left Eye Distance:   Bilateral Distance:    Right Eye Near:   Left Eye Near:    Bilateral Near:     Physical Exam Vitals and nursing note reviewed.  Constitutional:      General: She is not in acute distress.    Appearance: Normal appearance. She is normal weight. She is not toxic-appearing.  Eyes:     Conjunctiva/sclera: Conjunctivae normal.  Cardiovascular:     Rate and Rhythm: Normal rate and regular rhythm.     Heart sounds: Normal heart sounds.  Pulmonary:     Effort: Pulmonary effort is normal.     Breath sounds: Normal breath sounds and air entry.  Abdominal:     General: Abdomen is flat. Bowel sounds are normal.     Palpations: Abdomen is soft. There is no mass or pulsatile mass.     Tenderness: There is no abdominal tenderness. There is no right CVA tenderness, left CVA tenderness, guarding or rebound. Negative signs include Murphy's sign, Rovsing's sign, McBurney's sign, psoas sign and obturator sign.     Hernia: No hernia is present.   Lymphadenopathy:     Cervical:     Right cervical: No posterior cervical adenopathy.    Left cervical: No posterior cervical adenopathy.  Skin:    General: Skin is warm and dry.  Neurological:     Mental Status: She is alert and oriented to person, place, and time.  Psychiatric:        Mood and Affect: Mood normal.        Behavior: Behavior normal.      UC Treatments / Results  Labs (all labs ordered are listed, but only abnormal results are displayed) Labs Reviewed - No data to display  EKG   Radiology No results found.  Procedures Procedures (including critical care time)  Medications Ordered in UC Medications - No data to display  Initial Impression / Assessment and Plan / UC Course  I have reviewed the triage vital signs and the nursing notes.  Pertinent labs & imaging results that were available during my care of the patient were reviewed by me and considered in my medical decision making (see chart for details).     Constipation Successfully managed at home with fiber supplementation.  Advised patient for prevention to maintain fluid intake at 64 ounces of fluid a day.  Also advised daily fiber supplement if not getting enough fiber and regular diet.  If she develops active constipation, she may use MiraLAX 17 g once daily until regular bowel movement is achieved.  If failure with MiraLAX, she may use OTC stimulant laxative such as senna or stool softener such as Colace.  Final diagnoses:  Other constipation     Discharge Instructions      Constipation: Self-care strategies Increase fiber to ?25-35 g/day (fruits, vegetables, whole grains, psyllium). Drink adequate fluids (aim for pale yellow urine). Regular activity (walking daily). Establish a toilet routine after meals; use a footstool to improve posture and avoid straining. Medications (if lifestyle changes arent enough) First line: Osmotic laxatives such as polyethylene glycol (PEG) or  lactulose. If needed: Short-term stimulant laxatives (senna or bisacodyl). When to seek medical attention Blood in stool, black stools, unintentional weight loss, anemia, or persistent/severe abdominal pain. New or sudden change in bowel habits. Constipation lasting >3 months or not improving with first-line treatments. Constipation with vomiting, fever, or inability to pass gas.      ED Prescriptions  None    PDMP not reviewed this encounter.    [1]  Social History Tobacco Use   Smoking status: Never   Smokeless tobacco: Never  Vaping Use   Vaping status: Never Used  Substance Use Topics   Alcohol use: Yes    Comment: rarely   Drug use: Not Currently    Types: Marijuana     Leatrice Vernell HERO, NP 03/16/24 1408  "

## 2024-03-16 NOTE — ED Triage Notes (Signed)
 Pt states she had stomach pain and constipation today. States she is feeling better presents for doctors note.
